# Patient Record
Sex: Male | Born: 1960 | Race: White | Hispanic: No | Marital: Married | Smoking: Current every day smoker
Health system: Southern US, Community
[De-identification: ages and names within clinical notes are randomized; demographics above are authoritative.]

## PROBLEM LIST (undated history)

## (undated) DIAGNOSIS — I10 Essential (primary) hypertension: Secondary | ICD-10-CM

## (undated) HISTORY — DX: Essential (primary) hypertension: I10

---

## 2010-11-26 ENCOUNTER — Observation Stay (HOSPITAL_COMMUNITY)
Admission: EM | Admit: 2010-11-26 | Discharge: 2010-11-26 | Disposition: A | Discharge status: Home / Self Care | Payer: BC Managed Care – PPO | Attending: Emergency Medicine | Admitting: Emergency Medicine

## 2010-11-26 ENCOUNTER — Emergency Department (HOSPITAL_COMMUNITY): Payer: BC Managed Care – PPO

## 2010-11-26 DIAGNOSIS — R079 Chest pain, unspecified: Secondary | ICD-10-CM

## 2010-11-26 LAB — CBC
HCT: 40 % (ref 39.0–52.0)
Hemoglobin: 13.9 g/dL (ref 13.0–17.0)
MCH: 32.4 pg (ref 26.0–34.0)
MCHC: 34.8 g/dL (ref 30.0–36.0)
MCV: 93.2 fL (ref 78.0–100.0)
Platelets: 283 10*3/uL (ref 150–400)
RBC: 4.29 MIL/uL (ref 4.22–5.81)
RDW: 12.6 % (ref 11.5–15.5)
WBC: 8.5 10*3/uL (ref 4.0–10.5)

## 2010-11-26 LAB — POCT I-STAT, CHEM 8
BUN: 11 mg/dL (ref 6–23)
Calcium, Ion: 1.15 mmol/L (ref 1.12–1.32)
Chloride: 104 mEq/L (ref 96–112)
Creatinine, Ser: 0.8 mg/dL (ref 0.50–1.35)
Glucose, Bld: 89 mg/dL (ref 70–99)
HCT: 43 % (ref 39.0–52.0)
Hemoglobin: 14.6 g/dL (ref 13.0–17.0)
Potassium: 4 mEq/L (ref 3.5–5.1)
Sodium: 138 mEq/L (ref 135–145)
TCO2: 22 mmol/L (ref 0–100)

## 2010-11-26 LAB — CK TOTAL AND CKMB (NOT AT ARMC)
CK, MB: 1.7 ng/mL (ref 0.3–4.0)
Relative Index: INVALID (ref 0.0–2.5)
Total CK: 56 U/L (ref 7–232)

## 2010-11-26 LAB — DIFFERENTIAL
Basophils Absolute: 0 10*3/uL (ref 0.0–0.1)
Basophils Relative: 1 % (ref 0–1)
Eosinophils Absolute: 0.1 10*3/uL (ref 0.0–0.7)
Eosinophils Relative: 1 % (ref 0–5)
Lymphocytes Relative: 24 % (ref 12–46)
Lymphs Abs: 2 10*3/uL (ref 0.7–4.0)
Monocytes Absolute: 0.9 10*3/uL (ref 0.1–1.0)
Monocytes Relative: 11 % (ref 3–12)
Neutro Abs: 5.4 10*3/uL (ref 1.7–7.7)
Neutrophils Relative %: 64 % (ref 43–77)

## 2010-11-26 LAB — PROTIME-INR
INR: 1.07 (ref 0.00–1.49)
Prothrombin Time: 14.1 seconds (ref 11.6–15.2)

## 2010-11-26 LAB — TROPONIN I: Troponin I: <0.3 ng/mL (ref <0.30)

## 2010-11-26 MED ORDER — IOHEXOL 350 MG/ML SOLN
80.0000 mL | Freq: Once | INTRAVENOUS | Status: AC | PRN
  Administered 2010-11-26: 80 mL via INTRAVENOUS

## 2010-11-29 NOTE — Consult Note (Signed)
NAMEMILBERN, DOESCHER                   ACCOUNT NO.:  [id number]  MEDICAL RECORD NO.:  [id number]  LOCATION:  1899                         FACILITY:  [facility]  PHYSICIAN:  Noralyn Pick. Eden Emms, MD, FACCDATE OF BIRTH:  Apr 05, 1960  DATE OF CONSULTATION: DATE OF DISCHARGE:                                CONSULTATION   This 50 year old patient I was asked to see in the Chest Pain Unit after having a cardiac CT. The patient presented to the ER this morning with atypical left-sided pain.  He awoke with the pain.  It is positional, it is under his left ribs.  He is currently pain free unless he moves to certain way.  The patient ruled out for myocardial infarction.  He has a baseline normal EKG.  I spent 30 minutes reviewing his CT scan of his heart.  He has a normal right-dominant circulation.  There is no coronary anomalies.  His calcium score is 1.5.  He has less than 30% mixed plaque in the proximal right coronary artery.  The calcium score was 42nd percentile for age and sex matched controls.  The patient has no previously documented heart disease.  He does have hypertension.  He indicates that Dr. Wylene Simmer follows his cholesterol regularly.  He indicates that his good cholesterol counteracts his bad cholesterol and he is not on the statin.  He has smoked since the age of 79 about a pack a day.  He has not tried to quit recently.  In the past when he has tried to quit, he has done this without success.  His 10 point review of systems otherwise negative.  Past medical history is remarkable for hypertension and smoking.  He has not had previous surgery.  FAMILY HISTORY:  Negative for premature coronary disease.  He is happily married.  He smokes a pack a day.  He drinks on occasion. He works for a [employer].  He is otherwise fairly sedentary.   His medications in the ER included an aspirin, Ativan.  He got 10 mg of IV Lopressor for his scan.  He got a GI cocktail.  He is on  Zofran.  He indicates that he is on 2 blood pressure pills at home.  He indicates that he is allergic to SULFA and FLEXERIL.  PHYSICAL EXAMINATION:  VITAL SIGNS:  Remarkable for blood pressure 120/70, pulse of 62 and regular, respiratory rate 14, afebrile. GENERAL:  He has tattoos on his arm. HEENT:  Unremarkable.  Carotids are without bruit.  No lymphadenopathy, thyromegaly, JVP elevation. LUNGS:  Clear, good diaphragmatic motion.  No wheezing. HEART:  S1, S2 normal heart sounds.  PMI normal. ABDOMEN:  Benign.  Bowel sounds positive.  No AAA.  No tenderness.  No bruit.  No hepatosplenomegaly, hepatojugular reflux, or tenderness. EXTREMITIES:  Distal pulses are intact.  No edema. NEUROLOGIC:  Nonfocal. SKIN:  Warm and dry.  No muscular weakness.  Lab work is remarkable for negative CPK and troponin, normal pro time, hematocrit of 40, and an i-STAT which showed a potassium of 4 and a creatinine of 0.8.  Diagnostic chest x-ray showed no active lung disease with slight hyperaeration.  EKG  is normal.  IMPRESSION: 1. Atypical chest pain, musculoskeletal sounding, would treat with     anti-inflammatories, no need for further cardiac workup.  The     patient has minimal calcification of the right coronary artery.  I     told him to follow up with Dr. Wylene Simmer.  His LDL should be 130 or     less, if not, despite with his HDL, I would start him on low-dose     statin.  I encouraged him to take a baby aspirin a day. 2. Smoking.  Discussed smoking cessation with him.  He would be     reasonable candidate for Wellbutrin and nicotine patches or gum.     He will follow up with Dr. Wylene Simmer for this. 3. Hypertension continue home medications, currently appears well     controlled since the patient has good primary care followup, he     does not need Cardiology followup.  I went over the CAT scan results with the patient in detail and the patient is ready for discharge  from the  CPU.     Noralyn Pick. Eden Emms, MD, [facility]     PCN/MEDQ  D:  11/26/2010  T:  11/27/2010  Job:  [id number]  cc:   Gaspar Garbe, M.D.  Electronically Signed by Charlton Haws MD [facility] on 11/29/2010 08:51:46 PM

## 2011-02-05 ENCOUNTER — Ambulatory Visit: Payer: Worker's Compensation

## 2011-03-02 ENCOUNTER — Other Ambulatory Visit: Payer: Self-pay | Admitting: Orthopaedic Surgery

## 2011-03-02 DIAGNOSIS — M545 Low back pain, unspecified: Secondary | ICD-10-CM

## 2011-03-03 ENCOUNTER — Other Ambulatory Visit: Payer: Self-pay | Admitting: Orthopaedic Surgery

## 2011-03-03 DIAGNOSIS — Z139 Encounter for screening, unspecified: Secondary | ICD-10-CM

## 2011-03-06 ENCOUNTER — Ambulatory Visit
Admission: RE | Admit: 2011-03-06 | Discharge: 2011-03-06 | Disposition: A | Discharge status: Home / Self Care | Payer: Self-pay | Source: Ambulatory Visit | Attending: Orthopaedic Surgery | Admitting: Orthopaedic Surgery

## 2011-03-06 DIAGNOSIS — M545 Low back pain, unspecified: Secondary | ICD-10-CM

## 2011-03-06 DIAGNOSIS — Z139 Encounter for screening, unspecified: Secondary | ICD-10-CM

## 2014-07-02 ENCOUNTER — Ambulatory Visit (INDEPENDENT_AMBULATORY_CARE_PROVIDER_SITE_OTHER): Payer: Worker's Compensation | Admitting: Family Medicine

## 2014-07-02 ENCOUNTER — Ambulatory Visit: Payer: Worker's Compensation

## 2014-07-02 VITALS — BP 124/70 | HR 51 | Temp 97.7°F | Resp 18 | Ht 69.0 in | Wt 132.0 lb

## 2014-07-02 DIAGNOSIS — S161XXA Strain of muscle, fascia and tendon at neck level, initial encounter: Secondary | ICD-10-CM | POA: Diagnosis not present

## 2014-07-02 MED ORDER — CYCLOBENZAPRINE HCL 10 MG PO TABS: 10.0000 mg | ORAL_TABLET | Freq: Two times a day (BID) | ORAL | Status: DC | PRN

## 2014-07-02 NOTE — Patient Instructions (Signed)
Wear the neck collar as needed for support, and use the flexeril as needed for pain.  Remember this can make you sleepy!  Start with a 1/2 flexeril tablet- this may be enough.  Please see me on Wednesday- ask for me and I will see you as fast as possible.

## 2014-07-02 NOTE — Progress Notes (Signed)
Taylor RubensDon R Petersen 31-Jul-1960 54 y.o.   Chief Complaint  Patient presents with  . Neck Injury    6/2  . Neck Pain     Date of Injury: 06/28/14- Thursday, today is monday  History of Present Illness:  Presents for evaluation of work-related complaint. 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 His arm slipped, he caught himself and felt a pop in his neck.  He did not think too much of it, but the area was tender the next day and is still tender now.  He feels like his neck is a bit stiff.  It will pop and crack when he turns his head.  He would like to make sure that all is ok No sx into his arms, no numbness or weakness in his arms He is otherwise unhurt.    ROS    Allergies  Allergen Reactions  . Sulfa Antibiotics      Current medications reviewed and updated. Past medical history, family history, social history have been reviewed and updated.   Physical Exam  GEN: WDWN, NAD, Non-toxic, A & O x 3, slim build, looks well HEENT: Atraumatic, Normocephalic. Neck supple. No masses, No LAD. Ears and Nose: No external deformity. CV: RRR, No M/G/R. No JVD. No thrill. No extra heart sounds. PULM: CTA B, no wheezes, crackles, rhonchi. No retractions. No resp. distress. No accessory muscle use. EXTR: No c/c/e NEURO Normal gait.  PSYCH: Normally interactive. Conversant. Not depressed or anxious appearing.  Calm demeanor.  Normal strength, sensation, and DTR of bilateral UE He has mild tenderness with ROM of his neck, but ROM is normal.  No particular bony TTP but he does have soreness in the paraspinous cervical muscles  [facility] reading (PRIMARY) by  Dr. Patsy Lageropland. Cervical spine: negative for acute fracture or abnormal motion  CERVICAL SPINE 7 VIEWS, with flexion and extension views.  COMPARISON: None.  FINDINGS: 1.5 mm degenerative anterolisthesis at C4-5 and C5-6 in the neutral position is, no change  with flexion or extension. No prevertebral soft tissue swelling. Mild multilevel facet arthropathy. Intervertebral disc spaces maintained. No significant degree of abnormal motion. No overt osseous  The oblique view attempting to visualize the left neural foramina is nearly frontal and projection, and is not show the foraminal well.  IMPRESSION: 1. No cervical spine fracture or acute subluxation is identified. 2. There is 1.5 mm of grade 1 anterolisthesis at C4-5 and C5-6, without significant abnormal motion during flexion or extension.   He uses xanax QID, mobic as needed Assessment and Plan: Neck strain, initial encounter - Plan: DG Cervical Spine Complete, cyclobenzaprine (FLEXERIL) 10 MG tablet  Placed in a soft cervical collar which felt very good to him Continue his baseline mobic Flexeril to use as needed for pain- cautioned regarding sedation  Recheck here in 48 hours

## 2014-07-04 ENCOUNTER — Ambulatory Visit (INDEPENDENT_AMBULATORY_CARE_PROVIDER_SITE_OTHER): Payer: Worker's Compensation | Admitting: Family Medicine

## 2014-07-04 VITALS — BP 114/72 | HR 59 | Temp 98.8°F | Resp 18 | Ht 68.5 in | Wt 130.2 lb

## 2014-07-04 DIAGNOSIS — S161XXD Strain of muscle, fascia and tendon at neck level, subsequent encounter: Secondary | ICD-10-CM

## 2014-07-04 NOTE — Progress Notes (Signed)
[facility] [address], [place] [address] [phone]  Date:  07/04/2014   Name:  Taylor RubensDon R Petersen   DOB:  07-01-1960   MRN:  [id number]  PCP:  No primary care provider on file.    Chief Complaint: Follow-up   History of Present Illness:  Taylor Petersen is a 54 y.o. 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  The collar is hard to wear outdoors because it is hot.  He does not really notice the collar doing much  No arm sx such as numbness or weakness  There are no active problems to display for this patient.   Past Medical History  Diagnosis Date  . Hypertension     History reviewed. No pertinent past surgical history.  History  Substance Use Topics  . Smoking status: Current Every Day Smoker  . Smokeless tobacco: Not on file  . Alcohol Use: 0.0 oz/week    0 Standard drinks or equivalent per week    History reviewed. No pertinent family history.  Allergies  Allergen Reactions  . Sulfa Antibiotics     Medication list has been reviewed and updated.  Current Outpatient Prescriptions on File Prior to Visit  Medication Sig Dispense Refill  . ALPRAZolam (XANAX) 0.5 MG tablet Take 0.5 mg by mouth QID.    Marland Kitchen. atenolol (TENORMIN) 25 MG tablet Take 25 mg by mouth daily.    . benazepril (LOTENSIN) 10 MG tablet Take 10 mg by mouth daily.    . cyclobenzaprine (FLEXERIL) 10 MG tablet Take 1 tablet (10 mg total) by mouth 2 (two) times daily as needed for muscle spasms. 30 tablet 0  . meloxicam (MOBIC) 15 MG tablet Take 15 mg by mouth as needed for pain.     No current facility-administered medications on file prior to visit.    Review of Systems:  As per HPI- otherwise  negative.   Physical Examination: Filed Vitals:   07/04/14 1338  BP: 114/72  Pulse: 59  Temp: 98.8 F (37.1 C)  Resp: 18   Filed Vitals:   07/04/14 1338  Height: 5' 8.5" (1.74 m)  Weight: 130 lb 3.2 oz (59.058 kg)   Body mass index is 19.51 kg/(m^2). Ideal Body Weight: Weight in (lb) to have BMI = 25: 166.5  GEN: WDWN, NAD, Non-toxic, A & O x 3, slim build, looks well HEENT: Atraumatic, Normocephalic. Neck supple. No masses, No LAD. His cervical ROM is now full - this is an improvement.  He has tenderness in the right trapezius muscle Normal strength and sensation of both arms  Ears and Nose: No external deformity. CV: RRR, No M/G/R. No JVD. No thrill. No extra heart sounds. PULM: CTA B, no wheezes, crackles, rhonchi. No retractions. No resp. distress. No accessory muscle use. EXTR: No c/c/e NEURO Normal gait.  PSYCH: Normally interactive. Conversant. Not depressed or anxious appearing.  Calm demeanor.    Assessment and Plan: Cervical strain, subsequent encounter  He does not feel that he is able to RTW yet.  Will continue to be oow, recheck with me on Monday at which time I anticipate return to full dity   Signed Abbe AmsterdamJessica Copland, MD

## 2014-07-04 NOTE — Patient Instructions (Signed)
Please see me Monday morning for a recheck- we hope that you will retury to full duty then.   Continue using your soft collar and muscle relaxer as needed

## 2014-07-09 ENCOUNTER — Ambulatory Visit (INDEPENDENT_AMBULATORY_CARE_PROVIDER_SITE_OTHER): Payer: Worker's Compensation | Admitting: Family Medicine

## 2014-07-09 ENCOUNTER — Encounter: Payer: Self-pay | Admitting: Family Medicine

## 2014-07-09 VITALS — BP 106/82 | HR 65 | Temp 97.7°F | Resp 16 | Ht 68.0 in | Wt 129.0 lb

## 2014-07-09 DIAGNOSIS — S161XXD Strain of muscle, fascia and tendon at neck level, subsequent encounter: Secondary | ICD-10-CM | POA: Diagnosis not present

## 2014-07-09 DIAGNOSIS — M542 Cervicalgia: Secondary | ICD-10-CM | POA: Diagnosis not present

## 2014-07-09 NOTE — Progress Notes (Signed)
Taylor Petersen [birth year] 54 y.o.   Chief Complaint  Patient presents with  . Follow-up  . Neck Injury     Date of Injury: 06/28/14  History of Present Illness:  Presents for evaluation of work-related complaint.  He pulled his neck when he slipped and nearly fell on 6/2.  He was seen here on 6/6, and was taken out of work.   Rechecked on 6/8; at that time we had planned to recheck today and hopefully send him back to work.  He notes that he will still have a "catch" in his neck when he turns it to the left.  It is still sore over the nuchal ligament  No pain in his arms, no numbness or weakness in his arms.  He is not sure if he is till making progress; he continues to use icy hot patches and uses advil.  He feels like the patches help a lot. He did use his collar a couple of times over the weekend but it is very hot to wear in this weather.    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  He does a lot of lifting at work- he may lift up to 120 lbs.   He does feel that he is ready to RTW but would like to have a limit on lifting . ROS    Allergies  Allergen Reactions  . Sulfa Antibiotics      Current medications reviewed and updated. Past medical history, family history, social history have been reviewed and updated.   Physical Exam  GEN: WDWN, NAD, Non-toxic, A & O x 3, looks well HEENT: Atraumatic, Normocephalic. Neck supple. No masses, No LAD.  He has normal ROM of his cervical spine except for slight restriction of flexion.  He has mild tenderness over the right sided paraspinous muscles, left is ok.   Mild TTP over the C6/7 spinous processes.   Ears and Nose: No external deformity. CV: RRR, No M/G/R. No JVD. No thrill. No extra heart sounds. PULM: CTA B, no wheezes, crackles, rhonchi. No retractions. No resp. distress. No accessory muscle use. ABD: S, NT, ND, +BS. No rebound. No HSM. EXTR: No c/c/e Normal strength of BUE NEURO Normal gait.  PSYCH: Normally  interactive. Conversant. Not depressed or anxious appearing.  Calm demeanor.   Assessment and Plan:  Neck strain, subsequent encounter  Neck pain  Re-check of a WC neck strain.  May RTW with lifting restrictions.  Recheck here in one week

## 2014-07-09 NOTE — Patient Instructions (Signed)
Please come and see me next Monday 6/20 at 8am for a recheck

## 2014-07-16 ENCOUNTER — Encounter: Payer: Self-pay | Admitting: Family Medicine

## 2014-07-16 ENCOUNTER — Ambulatory Visit (INDEPENDENT_AMBULATORY_CARE_PROVIDER_SITE_OTHER): Payer: Worker's Compensation | Admitting: Family Medicine

## 2014-07-16 VITALS — BP 120/80 | HR 73 | Temp 97.5°F | Resp 16 | Ht 67.75 in | Wt 132.8 lb

## 2014-07-16 DIAGNOSIS — S161XXA Strain of muscle, fascia and tendon at neck level, initial encounter: Secondary | ICD-10-CM | POA: Diagnosis not present

## 2014-07-16 DIAGNOSIS — S161XXD Strain of muscle, fascia and tendon at neck level, subsequent encounter: Secondary | ICD-10-CM | POA: Diagnosis not present

## 2014-07-16 MED ORDER — CYCLOBENZAPRINE HCL 10 MG PO TABS: 10.0000 mg | ORAL_TABLET | Freq: Two times a day (BID) | ORAL | Status: DC | PRN

## 2014-07-16 NOTE — Progress Notes (Signed)
Taylor Petersen 04/09/1960 54 y.o.   Chief Complaint  Patient presents with  . Follow-up  . neck strain     Date of Injury: 06/28/14  History of Present Illness:  Presents for evaluation of work-related complaint.  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  At last check a week ago we allowed him to RTW but placed him on lifting restrictions.  Here today for a recheck  He worked last week and notes some "pain with getting in awkward positions. '  No numbness or weakness in his arms Overall he is improved but not 100% He is taking flexeril BID- he states it does not cause sedation during the day and it helps his muscles feel relaxed  ROS  Otherwise no complaints   Allergies  Allergen Reactions  . Sulfa Antibiotics      Current medications reviewed and updated. Past medical history, family history, social history have been reviewed and updated.   Physical Exam Filed Vitals:   07/16/14 0812  BP: 120/80  Pulse: 73  Temp: 97.5 F (36.4 C)  Resp: 16   GEN: WDWN, NAD, Non-toxic, A & O x 3 HEENT: Atraumatic, Normocephalic. Neck supple. No masses, No LAD.PEERL Ears and Nose: No external deformity. CV: RRR, No M/G/R. No JVD. No thrill. No extra heart sounds. PULM: CTA B, no wheezes, crackles, rhonchi. No retractions. No resp. distress. No accessory muscle use. EXTR: No c/c/e NEURO Normal gait.  PSYCH: Normally interactive. Conversant. Not depressed or anxious appearing.  Calm demeanor.  Cervical spine: he has full ROM of the cervical spine.  Slight tenderness in left sided paracervical muscles, no bony TTP Normal BUE strength, sensation and DTR  Assessment and Plan: Cervical strain, subsequent encounter  Neck strain, initial encounter - Plan: cyclobenzaprine (FLEXERIL) 10 MG tablet  Recheck in one week, continue lifting restrictions Refilled flexeril Improved overall, hope to release to full duty next week

## 2014-07-16 NOTE — Patient Instructions (Signed)
Glad that you are continuing to improve.  Continue flexeril as needed but do be advised that you should not drive or operate machinery while on this medication Please see me in one week, continue current work restrictions until then

## 2014-07-23 ENCOUNTER — Ambulatory Visit (INDEPENDENT_AMBULATORY_CARE_PROVIDER_SITE_OTHER): Payer: Worker's Compensation | Admitting: Family Medicine

## 2014-07-23 ENCOUNTER — Encounter: Payer: Self-pay | Admitting: Family Medicine

## 2014-07-23 VITALS — BP 116/90 | HR 65 | Temp 97.7°F | Resp 16 | Ht 67.75 in | Wt 132.4 lb

## 2014-07-23 DIAGNOSIS — S161XXD Strain of muscle, fascia and tendon at neck level, subsequent encounter: Secondary | ICD-10-CM | POA: Diagnosis not present

## 2014-07-23 DIAGNOSIS — M542 Cervicalgia: Secondary | ICD-10-CM | POA: Diagnosis not present

## 2014-07-23 NOTE — Progress Notes (Signed)
Taylor RubensDon R Petersen 05-02-1960 54 y.o.   No chief complaint on file.    Date of Injury: 06/28/14  History of Present Illness:  Presents for evaluation of work-related complaint. 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   He has been back to work with a lifting restriction.  Last seen a week ago at which point he was continuing to improve but still not 100%.  He has been allowed to lift up to 30 lbs at work so far  He states that he is a "lot better."  Range of motion in his neck is good but he still has some cracking and popping in his neck He has no numbness or weakness in his arms Overall he is improved.    ROS    Allergies  Allergen Reactions  . Sulfa Antibiotics      Current medications reviewed and updated. Past medical history, family history, social history have been reviewed and updated.   Physical Exam  GEN: WDWN, NAD, Non-toxic, A & O x 3 HEENT: Atraumatic, Normocephalic. Neck supple. No masses, No LAD. Ears and Nose: No external deformity. CV: RRR, No M/G/R. No JVD. No thrill. No extra heart sounds. PULM: CTA B, no wheezes, crackles, rhonchi. No retractions. No resp. distress. No accessory muscle use.Marland Kitchen. EXTR: No c/c/e NEURO Normal gait.  PSYCH: Normally interactive. Conversant. Not depressed or anxious appearing.  Calm demeanor.  No cervical spine or paraspinous muscle tenderness Normal flexion and extension.  45 degrees of rotation to the left and approx 60 to the right Normal BUE strength and sensation, normal biceps DTR.   Assessment and Plan:  Neck strain, subsequent encounter  Neck pain  Doing well, sx now resolved except for baseline crepitus- will release to full duty.  Follow-up if needed

## 2014-07-23 NOTE — Patient Instructions (Signed)
You do not have to see us again for this injury unless needed.  Take care!

## 2014-07-25 ENCOUNTER — Encounter: Payer: Self-pay | Admitting: Family Medicine

## 2014-07-25 ENCOUNTER — Telehealth: Payer: Self-pay | Admitting: Family Medicine

## 2014-07-25 NOTE — Telephone Encounter (Signed)
Corrected this error- my apologies.  Called pt and left message about this

## 2014-07-25 NOTE — Telephone Encounter (Signed)
-----   Message from Mertha BaarsSuzy R Dixon sent at 07/25/2014 11:34 AM EDT ----- Regarding: W/C 07/23/14 wrok note Dr Patsy Lageropland, this patient was evaluated by you on 07/23/14, and he was released to full duty. However, the Return to Work note states, he may not return to full duty. Can we get a corrected work note for the employer and [facility]? Thanks! suzy

## 2014-07-26 ENCOUNTER — Telehealth: Payer: Self-pay

## 2014-07-26 NOTE — Telephone Encounter (Signed)
Mr. Taylor Petersen came in today to drop off a form to be filed out by Dr. Patsy Lageropland. It is in the nurse's box.

## 2014-07-27 NOTE — Telephone Encounter (Signed)
Dr. Patsy Lageropland in your box.

## 2014-07-27 NOTE — Telephone Encounter (Signed)
Done 7/1- will turn in to pt

## 2014-08-06 ENCOUNTER — Other Ambulatory Visit: Payer: Self-pay | Admitting: Family Medicine

## 2014-08-07 ENCOUNTER — Telehealth: Payer: Self-pay

## 2014-08-07 DIAGNOSIS — S161XXA Strain of muscle, fascia and tendon at neck level, initial encounter: Secondary | ICD-10-CM

## 2014-08-07 MED ORDER — CYCLOBENZAPRINE HCL 10 MG PO TABS: 10.0000 mg | ORAL_TABLET | Freq: Two times a day (BID) | ORAL | Status: DC | PRN

## 2014-08-07 NOTE — Telephone Encounter (Signed)
Pt left a message on my voicemail last night, requesting a refill on a muscle relaxer Please call pt to verify if medication can be refilled

## 2014-08-07 NOTE — Telephone Encounter (Signed)
Medication   cyclobenzaprine (FLEXERIL) 10 MG tablet [2017]       cyclobenzaprine (FLEXERIL) 10 MG tablet [[id number]][[id number]]     Order Details    Dose: 10 mg Route: Oral Frequency: 2 times daily PRN for muscle spasms   Dispense Quantity:  30 tablet Refills:  0 Fills Remaining:  0          Sig: Take 1 tablet (10 mg total) by mouth 2 (two) times daily as needed for muscle spasms.         Written Date:  07/16/14 Expiration Date:  07/16/15     Start Date:  07/16/14 End Date:  --     Ordering Provider:  -- Authorizing Provider:  Pearline CablesJessica C Copland, MD Ordering User:  Pearline CablesJessica C Copland, MD          Diagnosis Association: Neck strain, initial encounter (847.0)             Original Order:  cyclobenzaprine (FLEXERIL) 10 MG tablet [[id number]][[id number]]        Pharmacy:  [id number] - [place], [place] - [phone]      Pharmacy Comments:  --         Quantity Remaining:  0 tablet Quantity Filled:  0 tablet              Order Class     Print     All Administrations of cyclobenzaprine (FLEXERIL) 10 MG tablet     No Administrations Recorded              Warnings History     No Interaction Warnings Shown      Order History  Outpatient    Date/Time Action Taken User Additional Information    07/16/14 0822 Sign Pearline CablesJessica C Copland, MD ReorderfromOrder:[id number]    07/16/14 [id number] Taking Flag Checked Pearline CablesJessica C Copland, MD     07/23/14 0824 Taking Flag Checked Nedra HaiWanda R Robinson, Brand [facility]     08/06/14 1954 Reorder Surescripts Out Interface ToOrder:[id number]      Medication Detail       Disp Refills Start End      cyclobenzaprine (FLEXERIL) 10 MG tablet 30 tablet 0 07/16/2014      Sig - Route: Take 1 tablet (10 mg total) by mouth 2 (two) times daily as needed for muscle spasms. - Oral     Class: Print

## 2015-05-17 DIAGNOSIS — Z0000 Encounter for general adult medical examination without abnormal findings: Secondary | ICD-10-CM | POA: Diagnosis not present

## 2015-05-17 DIAGNOSIS — I10 Essential (primary) hypertension: Secondary | ICD-10-CM | POA: Diagnosis not present

## 2015-05-17 DIAGNOSIS — Z125 Encounter for screening for malignant neoplasm of prostate: Secondary | ICD-10-CM | POA: Diagnosis not present

## 2015-05-24 DIAGNOSIS — E871 Hypo-osmolality and hyponatremia: Secondary | ICD-10-CM | POA: Diagnosis not present

## 2015-05-24 DIAGNOSIS — J449 Chronic obstructive pulmonary disease, unspecified: Secondary | ICD-10-CM | POA: Diagnosis not present

## 2015-05-24 DIAGNOSIS — M19011 Primary osteoarthritis, right shoulder: Secondary | ICD-10-CM | POA: Diagnosis not present

## 2015-05-24 DIAGNOSIS — Z0000 Encounter for general adult medical examination without abnormal findings: Secondary | ICD-10-CM | POA: Diagnosis not present

## 2015-05-24 DIAGNOSIS — F17200 Nicotine dependence, unspecified, uncomplicated: Secondary | ICD-10-CM | POA: Diagnosis not present

## 2015-09-24 DIAGNOSIS — R7989 Other specified abnormal findings of blood chemistry: Secondary | ICD-10-CM | POA: Diagnosis not present

## 2015-09-24 DIAGNOSIS — Z681 Body mass index (BMI) 19 or less, adult: Secondary | ICD-10-CM | POA: Diagnosis not present

## 2015-09-24 DIAGNOSIS — R1030 Lower abdominal pain, unspecified: Secondary | ICD-10-CM | POA: Diagnosis not present

## 2015-10-07 ENCOUNTER — Encounter: Payer: Self-pay | Admitting: Internal Medicine

## 2015-11-22 DIAGNOSIS — Z23 Encounter for immunization: Secondary | ICD-10-CM | POA: Diagnosis not present

## 2015-11-22 DIAGNOSIS — I10 Essential (primary) hypertension: Secondary | ICD-10-CM | POA: Diagnosis not present

## 2015-11-22 DIAGNOSIS — Z1389 Encounter for screening for other disorder: Secondary | ICD-10-CM | POA: Diagnosis not present

## 2015-11-22 DIAGNOSIS — F17200 Nicotine dependence, unspecified, uncomplicated: Secondary | ICD-10-CM | POA: Diagnosis not present

## 2015-11-22 DIAGNOSIS — R1084 Generalized abdominal pain: Secondary | ICD-10-CM | POA: Diagnosis not present

## 2015-11-22 DIAGNOSIS — F418 Other specified anxiety disorders: Secondary | ICD-10-CM | POA: Diagnosis not present

## 2015-12-12 ENCOUNTER — Ambulatory Visit: Payer: Self-pay | Admitting: Internal Medicine

## 2016-05-19 DIAGNOSIS — Z0000 Encounter for general adult medical examination without abnormal findings: Secondary | ICD-10-CM | POA: Diagnosis not present

## 2016-05-19 DIAGNOSIS — Z125 Encounter for screening for malignant neoplasm of prostate: Secondary | ICD-10-CM | POA: Diagnosis not present

## 2016-05-19 DIAGNOSIS — I10 Essential (primary) hypertension: Secondary | ICD-10-CM | POA: Diagnosis not present

## 2016-05-26 DIAGNOSIS — R7989 Other specified abnormal findings of blood chemistry: Secondary | ICD-10-CM | POA: Diagnosis not present

## 2016-05-26 DIAGNOSIS — F17200 Nicotine dependence, unspecified, uncomplicated: Secondary | ICD-10-CM | POA: Diagnosis not present

## 2016-05-26 DIAGNOSIS — Z0000 Encounter for general adult medical examination without abnormal findings: Secondary | ICD-10-CM | POA: Diagnosis not present

## 2016-05-26 DIAGNOSIS — J449 Chronic obstructive pulmonary disease, unspecified: Secondary | ICD-10-CM | POA: Diagnosis not present

## 2016-05-26 DIAGNOSIS — R808 Other proteinuria: Secondary | ICD-10-CM | POA: Diagnosis not present

## 2016-05-26 DIAGNOSIS — Z1389 Encounter for screening for other disorder: Secondary | ICD-10-CM | POA: Diagnosis not present

## 2016-11-23 DIAGNOSIS — Z23 Encounter for immunization: Secondary | ICD-10-CM | POA: Diagnosis not present

## 2016-11-23 DIAGNOSIS — J449 Chronic obstructive pulmonary disease, unspecified: Secondary | ICD-10-CM | POA: Diagnosis not present

## 2016-11-23 DIAGNOSIS — F17200 Nicotine dependence, unspecified, uncomplicated: Secondary | ICD-10-CM | POA: Diagnosis not present

## 2016-11-23 DIAGNOSIS — M19011 Primary osteoarthritis, right shoulder: Secondary | ICD-10-CM | POA: Diagnosis not present

## 2016-11-23 DIAGNOSIS — E871 Hypo-osmolality and hyponatremia: Secondary | ICD-10-CM | POA: Diagnosis not present

## 2017-05-15 IMAGING — CR DG CERVICAL SPINE COMPLETE 4+V
7 series · 7 of 7 positions shown · non-contrast
Comparison: None.

CLINICAL DATA: Neck injury during a fall from a ladder.

EXAM:
CERVICAL SPINE  7 VIEWS, with flexion and extension views.

[lpo]
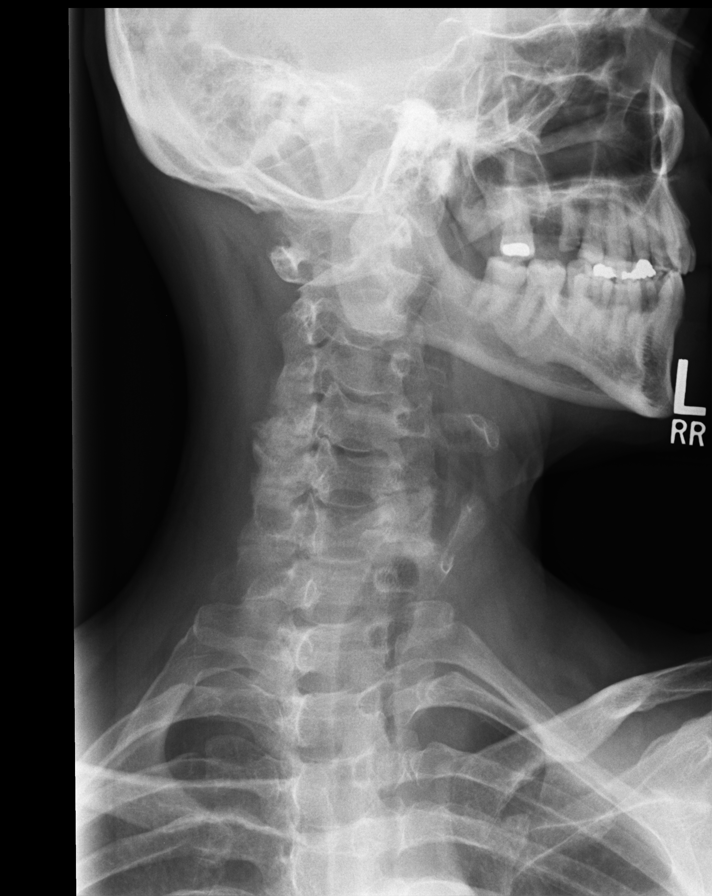

[lateral]
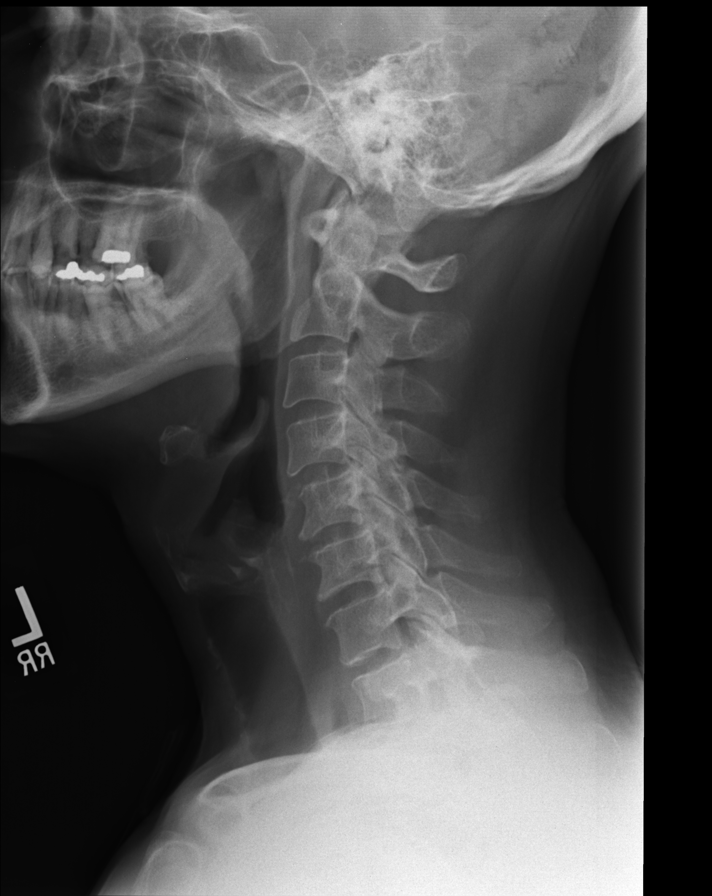

[rpo]
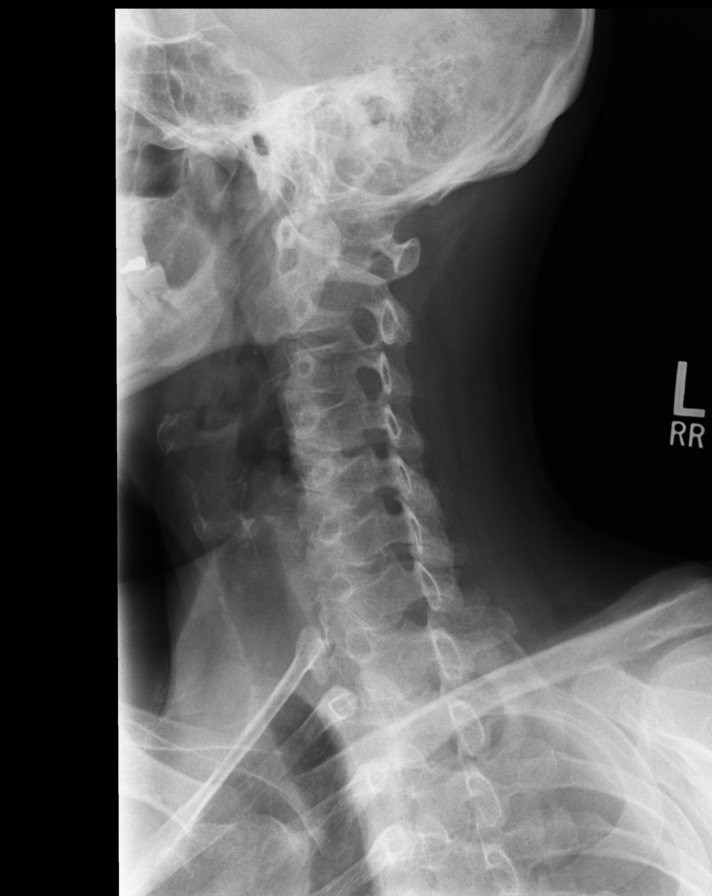

[AP]
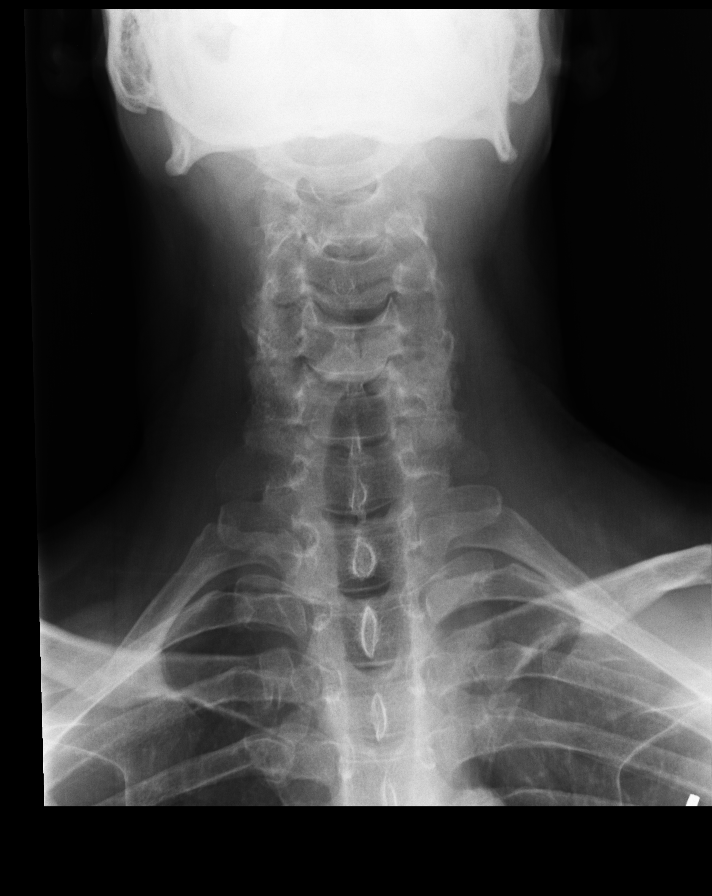

[ap open mouth]
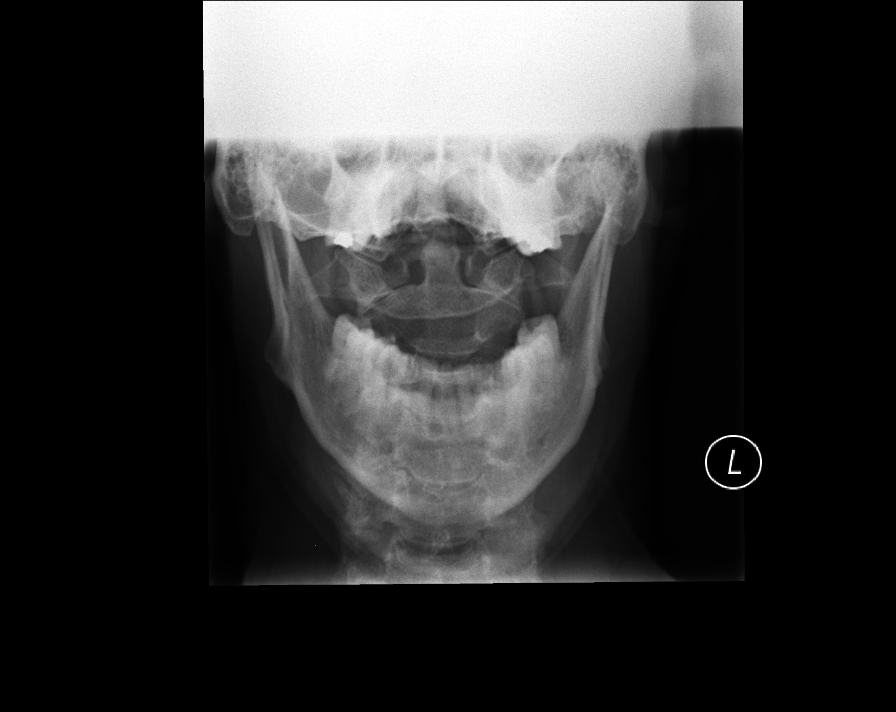

[lateral flex]
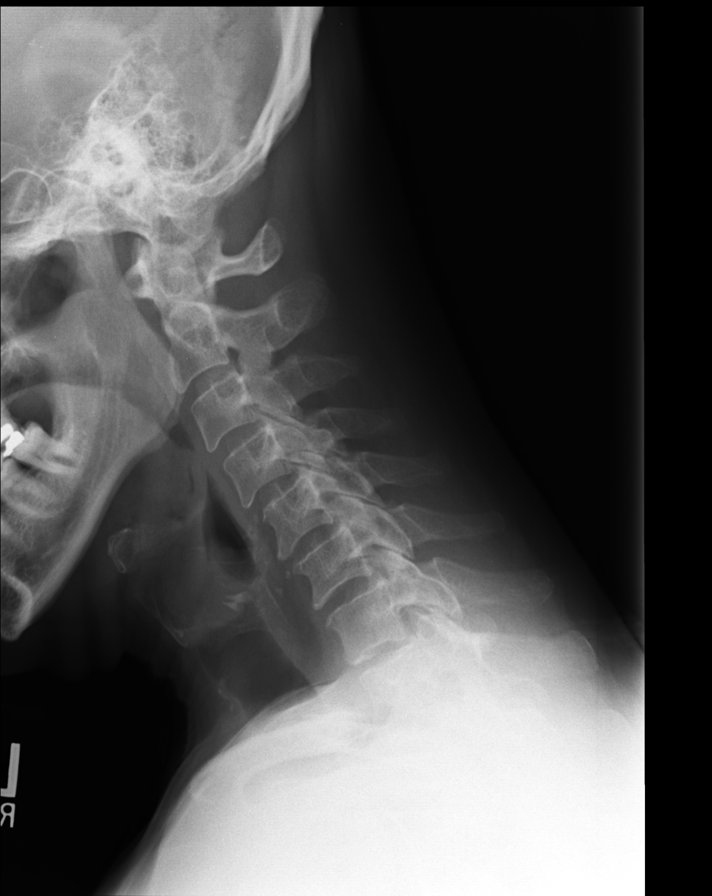

[lateral ext]
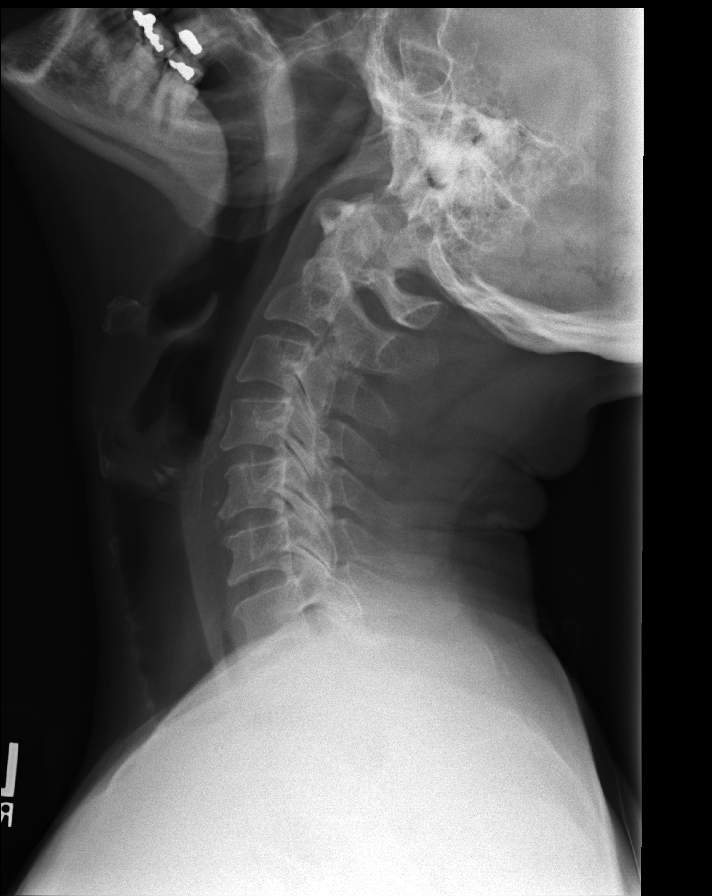

[7 of 7 positions shown; findings below may reference images not displayed]

FINDINGS: 1.5 mm degenerative anterolisthesis at C4-5 and C5-6 in the neutral
position is, no change with flexion or extension. No prevertebral
soft tissue swelling. Mild multilevel facet arthropathy.
Intervertebral disc spaces maintained. No significant degree of
abnormal motion. No overt osseous

The oblique view attempting to visualize the left neural foramina is
nearly frontal and projection, and is not show the foraminal well.
IMPRESSION: 1. No cervical spine fracture or acute subluxation is identified.
2. There is 1.5 mm of grade 1 anterolisthesis at C4-5 and C5-6,
without significant abnormal motion during flexion or extension.

## 2017-05-28 DIAGNOSIS — I10 Essential (primary) hypertension: Secondary | ICD-10-CM | POA: Diagnosis not present

## 2017-05-28 DIAGNOSIS — Z0000 Encounter for general adult medical examination without abnormal findings: Secondary | ICD-10-CM | POA: Diagnosis not present

## 2017-05-28 DIAGNOSIS — R82998 Other abnormal findings in urine: Secondary | ICD-10-CM | POA: Diagnosis not present

## 2017-05-28 DIAGNOSIS — Z125 Encounter for screening for malignant neoplasm of prostate: Secondary | ICD-10-CM | POA: Diagnosis not present

## 2017-06-03 DIAGNOSIS — M19011 Primary osteoarthritis, right shoulder: Secondary | ICD-10-CM | POA: Diagnosis not present

## 2017-06-03 DIAGNOSIS — F17200 Nicotine dependence, unspecified, uncomplicated: Secondary | ICD-10-CM | POA: Diagnosis not present

## 2017-06-03 DIAGNOSIS — J449 Chronic obstructive pulmonary disease, unspecified: Secondary | ICD-10-CM | POA: Diagnosis not present

## 2017-06-03 DIAGNOSIS — Z1389 Encounter for screening for other disorder: Secondary | ICD-10-CM | POA: Diagnosis not present

## 2017-06-03 DIAGNOSIS — Z0000 Encounter for general adult medical examination without abnormal findings: Secondary | ICD-10-CM | POA: Diagnosis not present

## 2017-06-03 DIAGNOSIS — F418 Other specified anxiety disorders: Secondary | ICD-10-CM | POA: Diagnosis not present

## 2017-12-01 DIAGNOSIS — Z23 Encounter for immunization: Secondary | ICD-10-CM | POA: Diagnosis not present

## 2017-12-01 DIAGNOSIS — I10 Essential (primary) hypertension: Secondary | ICD-10-CM | POA: Diagnosis not present

## 2017-12-01 DIAGNOSIS — J449 Chronic obstructive pulmonary disease, unspecified: Secondary | ICD-10-CM | POA: Diagnosis not present

## 2017-12-01 DIAGNOSIS — F418 Other specified anxiety disorders: Secondary | ICD-10-CM | POA: Diagnosis not present

## 2017-12-01 DIAGNOSIS — F17200 Nicotine dependence, unspecified, uncomplicated: Secondary | ICD-10-CM | POA: Diagnosis not present

## 2018-05-30 DIAGNOSIS — Z125 Encounter for screening for malignant neoplasm of prostate: Secondary | ICD-10-CM | POA: Diagnosis not present

## 2018-05-30 DIAGNOSIS — I10 Essential (primary) hypertension: Secondary | ICD-10-CM | POA: Diagnosis not present

## 2018-05-30 DIAGNOSIS — E7800 Pure hypercholesterolemia, unspecified: Secondary | ICD-10-CM | POA: Diagnosis not present

## 2018-06-06 DIAGNOSIS — F1320 Sedative, hypnotic or anxiolytic dependence, uncomplicated: Secondary | ICD-10-CM | POA: Diagnosis not present

## 2018-06-06 DIAGNOSIS — J449 Chronic obstructive pulmonary disease, unspecified: Secondary | ICD-10-CM | POA: Diagnosis not present

## 2018-06-06 DIAGNOSIS — Z0000 Encounter for general adult medical examination without abnormal findings: Secondary | ICD-10-CM | POA: Diagnosis not present

## 2018-06-06 DIAGNOSIS — F419 Anxiety disorder, unspecified: Secondary | ICD-10-CM | POA: Diagnosis not present

## 2018-06-06 DIAGNOSIS — I10 Essential (primary) hypertension: Secondary | ICD-10-CM | POA: Diagnosis not present

## 2018-06-06 DIAGNOSIS — Z1331 Encounter for screening for depression: Secondary | ICD-10-CM | POA: Diagnosis not present

## 2018-12-07 DIAGNOSIS — I10 Essential (primary) hypertension: Secondary | ICD-10-CM | POA: Diagnosis not present

## 2018-12-07 DIAGNOSIS — F17200 Nicotine dependence, unspecified, uncomplicated: Secondary | ICD-10-CM | POA: Diagnosis not present

## 2018-12-07 DIAGNOSIS — E7800 Pure hypercholesterolemia, unspecified: Secondary | ICD-10-CM | POA: Diagnosis not present

## 2018-12-07 DIAGNOSIS — F419 Anxiety disorder, unspecified: Secondary | ICD-10-CM | POA: Diagnosis not present

## 2019-05-31 DIAGNOSIS — E7800 Pure hypercholesterolemia, unspecified: Secondary | ICD-10-CM | POA: Diagnosis not present

## 2019-05-31 DIAGNOSIS — I10 Essential (primary) hypertension: Secondary | ICD-10-CM | POA: Diagnosis not present

## 2019-05-31 DIAGNOSIS — Z0000 Encounter for general adult medical examination without abnormal findings: Secondary | ICD-10-CM | POA: Diagnosis not present

## 2019-05-31 DIAGNOSIS — Z125 Encounter for screening for malignant neoplasm of prostate: Secondary | ICD-10-CM | POA: Diagnosis not present

## 2019-06-07 DIAGNOSIS — Z0000 Encounter for general adult medical examination without abnormal findings: Secondary | ICD-10-CM | POA: Diagnosis not present

## 2019-06-07 DIAGNOSIS — J449 Chronic obstructive pulmonary disease, unspecified: Secondary | ICD-10-CM | POA: Diagnosis not present

## 2019-06-07 DIAGNOSIS — I10 Essential (primary) hypertension: Secondary | ICD-10-CM | POA: Diagnosis not present

## 2019-06-07 DIAGNOSIS — Z1331 Encounter for screening for depression: Secondary | ICD-10-CM | POA: Diagnosis not present

## 2019-06-07 DIAGNOSIS — F17200 Nicotine dependence, unspecified, uncomplicated: Secondary | ICD-10-CM | POA: Diagnosis not present

## 2019-06-07 DIAGNOSIS — F419 Anxiety disorder, unspecified: Secondary | ICD-10-CM | POA: Diagnosis not present

## 2019-12-09 DIAGNOSIS — K219 Gastro-esophageal reflux disease without esophagitis: Secondary | ICD-10-CM | POA: Diagnosis not present

## 2019-12-09 DIAGNOSIS — R1013 Epigastric pain: Secondary | ICD-10-CM | POA: Diagnosis not present

## 2019-12-13 DIAGNOSIS — M545 Low back pain, unspecified: Secondary | ICD-10-CM | POA: Diagnosis not present

## 2019-12-13 DIAGNOSIS — I10 Essential (primary) hypertension: Secondary | ICD-10-CM | POA: Diagnosis not present

## 2019-12-13 DIAGNOSIS — E7800 Pure hypercholesterolemia, unspecified: Secondary | ICD-10-CM | POA: Diagnosis not present

## 2020-06-05 DIAGNOSIS — Z125 Encounter for screening for malignant neoplasm of prostate: Secondary | ICD-10-CM | POA: Diagnosis not present

## 2020-06-05 DIAGNOSIS — E7800 Pure hypercholesterolemia, unspecified: Secondary | ICD-10-CM | POA: Diagnosis not present

## 2020-06-12 DIAGNOSIS — I10 Essential (primary) hypertension: Secondary | ICD-10-CM | POA: Diagnosis not present

## 2020-06-12 DIAGNOSIS — Z0000 Encounter for general adult medical examination without abnormal findings: Secondary | ICD-10-CM | POA: Diagnosis not present

## 2020-06-12 DIAGNOSIS — Z1331 Encounter for screening for depression: Secondary | ICD-10-CM | POA: Diagnosis not present

## 2020-06-12 DIAGNOSIS — Z1389 Encounter for screening for other disorder: Secondary | ICD-10-CM | POA: Diagnosis not present

## 2020-12-23 DIAGNOSIS — I10 Essential (primary) hypertension: Secondary | ICD-10-CM | POA: Diagnosis not present

## 2020-12-23 DIAGNOSIS — Z23 Encounter for immunization: Secondary | ICD-10-CM | POA: Diagnosis not present

## 2021-06-24 DIAGNOSIS — E7800 Pure hypercholesterolemia, unspecified: Secondary | ICD-10-CM | POA: Diagnosis not present

## 2021-06-24 DIAGNOSIS — Z125 Encounter for screening for malignant neoplasm of prostate: Secondary | ICD-10-CM | POA: Diagnosis not present

## 2021-06-30 DIAGNOSIS — I10 Essential (primary) hypertension: Secondary | ICD-10-CM | POA: Diagnosis not present

## 2021-06-30 DIAGNOSIS — Z1339 Encounter for screening examination for other mental health and behavioral disorders: Secondary | ICD-10-CM | POA: Diagnosis not present

## 2021-06-30 DIAGNOSIS — Z1331 Encounter for screening for depression: Secondary | ICD-10-CM | POA: Diagnosis not present

## 2021-06-30 DIAGNOSIS — Z0000 Encounter for general adult medical examination without abnormal findings: Secondary | ICD-10-CM | POA: Diagnosis not present

## 2021-08-29 DIAGNOSIS — I10 Essential (primary) hypertension: Secondary | ICD-10-CM | POA: Diagnosis not present

## 2021-08-29 DIAGNOSIS — K219 Gastro-esophageal reflux disease without esophagitis: Secondary | ICD-10-CM | POA: Diagnosis not present

## 2021-10-01 DIAGNOSIS — Z1211 Encounter for screening for malignant neoplasm of colon: Secondary | ICD-10-CM | POA: Diagnosis not present

## 2021-10-01 DIAGNOSIS — R0789 Other chest pain: Secondary | ICD-10-CM | POA: Diagnosis not present

## 2021-10-17 DIAGNOSIS — R0789 Other chest pain: Secondary | ICD-10-CM | POA: Diagnosis not present

## 2021-10-17 DIAGNOSIS — K259 Gastric ulcer, unspecified as acute or chronic, without hemorrhage or perforation: Secondary | ICD-10-CM | POA: Diagnosis not present

## 2021-10-17 DIAGNOSIS — Z1211 Encounter for screening for malignant neoplasm of colon: Secondary | ICD-10-CM | POA: Diagnosis not present

## 2021-10-28 DIAGNOSIS — K200 Eosinophilic esophagitis: Secondary | ICD-10-CM | POA: Diagnosis not present

## 2021-11-26 DIAGNOSIS — K259 Gastric ulcer, unspecified as acute or chronic, without hemorrhage or perforation: Secondary | ICD-10-CM | POA: Diagnosis not present

## 2021-11-26 DIAGNOSIS — K2270 Barrett's esophagus without dysplasia: Secondary | ICD-10-CM | POA: Diagnosis not present

## 2021-11-26 DIAGNOSIS — Z5320 Procedure and treatment not carried out because of patient's decision for unspecified reasons: Secondary | ICD-10-CM | POA: Diagnosis not present

## 2021-11-26 DIAGNOSIS — R6884 Jaw pain: Secondary | ICD-10-CM | POA: Diagnosis not present

## 2022-01-26 HISTORY — PX: ESOPHAGOGASTRODUODENOSCOPY: SHX1529

## 2022-07-06 DIAGNOSIS — E7800 Pure hypercholesterolemia, unspecified: Secondary | ICD-10-CM | POA: Diagnosis not present

## 2022-07-06 DIAGNOSIS — K219 Gastro-esophageal reflux disease without esophagitis: Secondary | ICD-10-CM | POA: Diagnosis not present

## 2022-07-06 DIAGNOSIS — I10 Essential (primary) hypertension: Secondary | ICD-10-CM | POA: Diagnosis not present

## 2022-07-06 DIAGNOSIS — R7989 Other specified abnormal findings of blood chemistry: Secondary | ICD-10-CM | POA: Diagnosis not present

## 2022-07-06 DIAGNOSIS — Z125 Encounter for screening for malignant neoplasm of prostate: Secondary | ICD-10-CM | POA: Diagnosis not present

## 2022-07-06 DIAGNOSIS — F419 Anxiety disorder, unspecified: Secondary | ICD-10-CM | POA: Diagnosis not present

## 2022-07-13 DIAGNOSIS — I10 Essential (primary) hypertension: Secondary | ICD-10-CM | POA: Diagnosis not present

## 2022-07-13 DIAGNOSIS — Z0000 Encounter for general adult medical examination without abnormal findings: Secondary | ICD-10-CM | POA: Diagnosis not present

## 2022-07-13 DIAGNOSIS — Z1331 Encounter for screening for depression: Secondary | ICD-10-CM | POA: Diagnosis not present

## 2022-07-13 DIAGNOSIS — Z1339 Encounter for screening examination for other mental health and behavioral disorders: Secondary | ICD-10-CM | POA: Diagnosis not present

## 2022-07-13 DIAGNOSIS — J449 Chronic obstructive pulmonary disease, unspecified: Secondary | ICD-10-CM | POA: Diagnosis not present

## 2023-02-12 DIAGNOSIS — I10 Essential (primary) hypertension: Secondary | ICD-10-CM | POA: Diagnosis not present

## 2023-05-13 ENCOUNTER — Encounter: Payer: Self-pay | Admitting: Gastroenterology

## 2023-05-19 ENCOUNTER — Ambulatory Visit (AMBULATORY_SURGERY_CENTER): Payer: Self-pay

## 2023-05-19 VITALS — Ht 68.0 in | Wt 130.0 lb

## 2023-05-19 DIAGNOSIS — Z1211 Encounter for screening for malignant neoplasm of colon: Secondary | ICD-10-CM

## 2023-05-19 MED ORDER — NA SULFATE-K SULFATE-MG SULF 17.5-3.13-1.6 GM/177ML PO SOLN: 1.0000 | Freq: Once | ORAL | 0 refills | Status: AC

## 2023-05-19 NOTE — Progress Notes (Signed)
 Pre visit completed via phone call; Patient verified name, DOB, and address; No egg or soy allergy known to patient;  No issues known to pt with past sedation with any surgeries or procedures; Patient denies ever being told they had issues or difficulty with intubation;  No FH of Malignant Hyperthermia; Pt is not on diet pills; Pt is not on home 02;  Pt is not on blood thinners;  Pt denies issues with constipation;  No A fib or A flutter; Have any cardiac testing pending--NO Insurance verified during PV appt--- 69 Pt can ambulate without assistance;  Pt denies use of chewing tobacco; Discussed diabetic/weight loss medication holds; Discussed NSAID holds; Checked BMI to be less than 50; Pt instructed to use Single[employer]com or GoodRx for a price reduction on prep;  Patient's chart reviewed by Rogena Class CNRA prior to previsit and patient appropriate for the [facility];  Pre visit completed and red dot placed by patient's name on their procedure day (on provider's schedule).    Instructions printed and mailed to patient per his request; patient requested a link be sent to him in order for him to sign up for MyChart;  email link sent;

## 2023-05-26 ENCOUNTER — Telehealth: Payer: Self-pay | Admitting: Gastroenterology

## 2023-05-26 NOTE — Telephone Encounter (Signed)
 Good morning Dr. Karene Oto,   We received a call from this patient's wife, requesting for procedure to be cancelled for tomorrow at 7:30 AM. She states she never got the letter or any call back from trying to reach our prior authorization department in regards to procedure costs. She states once she is able to get in contact with them in regards, she will reschedule procedure.   Thank you.

## 2023-05-27 ENCOUNTER — Encounter: Payer: Self-pay | Admitting: Gastroenterology

## 2023-06-29 DIAGNOSIS — R197 Diarrhea, unspecified: Secondary | ICD-10-CM | POA: Diagnosis not present

## 2023-06-29 DIAGNOSIS — K2270 Barrett's esophagus without dysplasia: Secondary | ICD-10-CM | POA: Diagnosis not present

## 2023-06-29 DIAGNOSIS — Z1211 Encounter for screening for malignant neoplasm of colon: Secondary | ICD-10-CM | POA: Diagnosis not present

## 2023-07-15 DIAGNOSIS — Z1211 Encounter for screening for malignant neoplasm of colon: Secondary | ICD-10-CM | POA: Diagnosis not present

## 2023-07-15 DIAGNOSIS — K635 Polyp of colon: Secondary | ICD-10-CM | POA: Diagnosis not present

## 2023-07-15 DIAGNOSIS — K6389 Other specified diseases of intestine: Secondary | ICD-10-CM | POA: Diagnosis not present

## 2023-07-19 DIAGNOSIS — K635 Polyp of colon: Secondary | ICD-10-CM | POA: Diagnosis not present

## 2023-09-17 DIAGNOSIS — I10 Essential (primary) hypertension: Secondary | ICD-10-CM | POA: Diagnosis not present

## 2023-09-17 DIAGNOSIS — Z125 Encounter for screening for malignant neoplasm of prostate: Secondary | ICD-10-CM | POA: Diagnosis not present

## 2023-09-24 DIAGNOSIS — Z1331 Encounter for screening for depression: Secondary | ICD-10-CM | POA: Diagnosis not present

## 2023-09-24 DIAGNOSIS — Z0000 Encounter for general adult medical examination without abnormal findings: Secondary | ICD-10-CM | POA: Diagnosis not present

## 2023-09-24 DIAGNOSIS — Z1339 Encounter for screening examination for other mental health and behavioral disorders: Secondary | ICD-10-CM | POA: Diagnosis not present

## 2023-09-24 DIAGNOSIS — I10 Essential (primary) hypertension: Secondary | ICD-10-CM | POA: Diagnosis not present

## 2023-09-30 DIAGNOSIS — F17210 Nicotine dependence, cigarettes, uncomplicated: Secondary | ICD-10-CM | POA: Diagnosis not present

## 2023-09-30 DIAGNOSIS — K621 Rectal polyp: Secondary | ICD-10-CM | POA: Diagnosis not present

## 2023-09-30 DIAGNOSIS — Z860100 Personal history of colon polyps, unspecified: Secondary | ICD-10-CM | POA: Diagnosis not present

## 2023-09-30 DIAGNOSIS — Z79899 Other long term (current) drug therapy: Secondary | ICD-10-CM | POA: Diagnosis not present

## 2023-09-30 DIAGNOSIS — I10 Essential (primary) hypertension: Secondary | ICD-10-CM | POA: Diagnosis not present

## 2023-09-30 DIAGNOSIS — D128 Benign neoplasm of rectum: Secondary | ICD-10-CM | POA: Diagnosis not present
